# Patient Record
Sex: Male | Born: 1995 | Race: White | Hispanic: No | Marital: Single | Smoking: Former smoker
Health system: Southern US, Community
[De-identification: ages and names within clinical notes are randomized; demographics above are authoritative.]

## PROBLEM LIST (undated history)

## (undated) DIAGNOSIS — F411 Generalized anxiety disorder: Secondary | ICD-10-CM

## (undated) DIAGNOSIS — Z22322 Carrier or suspected carrier of Methicillin resistant Staphylococcus aureus: Secondary | ICD-10-CM

## (undated) HISTORY — PX: WISDOM TOOTH EXTRACTION: SHX21

---
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
## 2020-07-08 ENCOUNTER — Telehealth: Payer: Self-pay

## 2020-07-08 ENCOUNTER — Other Ambulatory Visit: Payer: Self-pay

## 2020-07-08 ENCOUNTER — Emergency Department (INDEPENDENT_AMBULATORY_CARE_PROVIDER_SITE_OTHER)
Admission: EM | Admit: 2020-07-08 | Discharge: 2020-07-08 | Disposition: A | Discharge status: Home / Self Care | Payer: Commercial Managed Care - PPO | Source: Home / Self Care

## 2020-07-08 DIAGNOSIS — L089 Local infection of the skin and subcutaneous tissue, unspecified: Secondary | ICD-10-CM | POA: Diagnosis not present

## 2020-07-08 MED ORDER — MUPIROCIN 2 % EX OINT: TOPICAL_OINTMENT | CUTANEOUS | 0 refills | Status: DC

## 2020-07-08 NOTE — ED Provider Notes (Signed)
[name]    CSN: [id number] Arrival date & time: 07/08/20  4098      History   Chief Complaint Chief Complaint  Patient presents with  . Discharge from belly button    HPI Howard Jones is a 25 y.o. male.   HPI Howard Jones is a 25 y.o. male presenting to UC with c/o small amount of bloody discharge from his belly button last night and a small red bump. Denies pain but had a stinging sensation when he cleaned the area.  Denies fever, chills, n/v/d. No known injury. No hx of same.   History reviewed. No pertinent past medical history.  There are no problems to display for this patient.   History reviewed. No pertinent surgical history.     Home Medications    Prior to Admission medications   Medication Sig Start Date End Date Taking? Authorizing Provider  mupirocin ointment (BACTROBAN) 2 % Apply to wound 3 times daily for 5 days 07/08/20  Yes Doroteo Glassman, Donovan Persley O, PA-C  cephALEXin (KEFLEX) 500 MG capsule Take 1 capsule (500 mg total) by mouth 3 (three) times daily. 10/15/15   Menshew, Charlesetta Ivory, PA-C  guaiFENesin-codeine 100-10 MG/5ML syrup Take 10 mLs by mouth every 4 (four) hours as needed for cough. 08/16/15   Beers, Charmayne Sheer, PA-C  ibuprofen (ADVIL,MOTRIN) 800 MG tablet Take 1 tablet (800 mg total) by mouth every 8 (eight) hours as needed. 08/16/15   Beers, Charmayne Sheer, PA-C  oseltamivir (TAMIFLU) 75 MG capsule Take 1 capsule (75 mg total) by mouth 2 (two) times daily. 08/16/15   Beers, Charmayne Sheer, PA-C    Family History History reviewed. No pertinent family history.  Social History Social History   Tobacco Use  . Smoking status: Never Smoker  . Smokeless tobacco: Never Used  Vaping Use  . Vaping Use: Every day  Substance Use Topics  . Alcohol use: Not Currently     Allergies   Sulfa antibiotics   Review of Systems Review of Systems  Constitutional: Negative for appetite change, chills and fever.  Gastrointestinal: Negative for  abdominal pain, diarrhea, nausea and vomiting.  Skin: Positive for color change and wound.     Physical Exam Triage Vital Signs ED Triage Vitals  Enc Vitals Group     BP 07/08/20 0950 128/87     Pulse Rate 07/08/20 0950 (!) 111     Resp 07/08/20 0950 18     Temp 07/08/20 0950 98.2 F (36.8 C)     Temp Source 07/08/20 0950 Oral     SpO2 07/08/20 0950 98 %     Weight --      Height --      Head Circumference --      Peak Flow --      Pain Score 07/08/20 0951 0     Pain Loc --      Pain Edu? --      Excl. in GC? --    No data found.  Updated Vital Signs BP 128/87 (BP Location: Left Arm)   Pulse 97   Temp 98.2 F (36.8 C) (Oral)   Resp 18   SpO2 98%   Visual Acuity Right Eye Distance:   Left Eye Distance:   Bilateral Distance:    Right Eye Near:   Left Eye Near:    Bilateral Near:     Physical Exam Vitals and nursing note reviewed.  Constitutional:      General: He is  not in acute distress.    Appearance: Normal appearance. He is well-developed and well-nourished. He is not ill-appearing, toxic-appearing or diaphoretic.  HENT:     Head: Normocephalic and atraumatic.  Eyes:     Extraocular Movements: EOM normal.  Cardiovascular:     Rate and Rhythm: Normal rate.  Pulmonary:     Effort: Pulmonary effort is normal. No respiratory distress.  Abdominal:     General: There is no distension.     Palpations: Abdomen is soft.     Tenderness: There is no abdominal tenderness. There is no right CVA tenderness, left CVA tenderness, guarding or rebound.     Hernia: No hernia is present.    Musculoskeletal:        General: Normal range of motion.     Cervical back: Normal range of motion.  Skin:    General: Skin is warm and dry.  Neurological:     Mental Status: He is alert and oriented to person, place, and time.  Psychiatric:        Mood and Affect: Mood and affect normal.        Behavior: Behavior normal.      UC Treatments / Results  Labs (all labs  ordered are listed, but only abnormal results are displayed) Labs Reviewed - No data to display  EKG   Radiology No results found.  Procedures Procedures (including critical care time)  Medications Ordered in UC Medications - No data to display  Initial Impression / Assessment and Plan / UC Course  I have reviewed the triage vital signs and the nursing notes.  Pertinent labs & imaging results that were available during my care of the patient were reviewed by me and considered in my medical decision making (see chart for details).    Will tx with topical mupirocin Home care instructions discussed F/u next week if not improving, sooner if worsening.  Final Clinical Impressions(s) / UC Diagnoses   Final diagnoses:  Skin pustule     Discharge Instructions      Keep area clean with warm water and mild soap, pat dry. Apply the antibiotic as prescribed. Follow up in 1 week if not improving, sooner if worsening.     ED Prescriptions    Medication Sig Dispense Auth. Provider   mupirocin ointment (BACTROBAN) 2 % Apply to wound 3 times daily for 5 days 22 g Lurene Shadow, [place]     PDMP not reviewed this encounter.   Lurene Shadow, [place] 07/08/20 1223

## 2020-07-08 NOTE — Telephone Encounter (Signed)
Pt called because medication was not ready at pharmacy. Called pharmacy and was informed it would be ready in an hour. Pt notified.

## 2020-07-08 NOTE — Discharge Instructions (Signed)
°  Keep area clean with warm water and mild soap, pat dry. Apply the antibiotic as prescribed. Follow up in 1 week if not improving, sooner if worsening.

## 2020-07-08 NOTE — ED Triage Notes (Signed)
Pt c/o discharge from belly button that he noticed last night. Said when he examined further noticed a bed bump. Stinging sensation when he cleaned it. Pain 0/10

## 2020-09-15 ENCOUNTER — Other Ambulatory Visit: Payer: Self-pay

## 2020-09-15 ENCOUNTER — Emergency Department (INDEPENDENT_AMBULATORY_CARE_PROVIDER_SITE_OTHER)
Admission: EM | Admit: 2020-09-15 | Discharge: 2020-09-15 | Disposition: A | Discharge status: Home / Self Care | Payer: Commercial Managed Care - PPO | Source: Home / Self Care | Attending: Family Medicine | Admitting: Family Medicine

## 2020-09-15 DIAGNOSIS — K120 Recurrent oral aphthae: Secondary | ICD-10-CM

## 2020-09-15 MED ORDER — PREDNISONE 20 MG PO TABS: 20.0000 mg | ORAL_TABLET | Freq: Two times a day (BID) | ORAL | 0 refills | Status: DC

## 2020-09-15 NOTE — ED Provider Notes (Signed)
[name]    CSN: [id number] Arrival date & time: 09/15/20  1121      History   Chief Complaint Chief Complaint  Patient presents with  . Sore Throat    HPI Howard Jones is a 25 y.o. male.   HPI  Patient states that he gets sores inside his mouth periodically.  Currently has 1 on the inside of his lip and on his tonsil.  The one back in his tonsils very painful. 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  Ever since that he is very vigilant about any open wounds and wonders whether this could be MRSA in his throat He is otherwise in good health.  He is on no medications.  History reviewed. No pertinent past medical history.  There are no problems to display for this patient.   History reviewed. No pertinent surgical history.     Home Medications    Prior to Admission medications   Medication Sig Start Date End Date Taking? Authorizing Provider  predniSONE (DELTASONE) 20 MG tablet Take 1 tablet (20 mg total) by mouth 2 (two) times daily with a meal. 09/15/20  Yes Eustace Moore, MD  ibuprofen (ADVIL,MOTRIN) 800 MG tablet Take 1 tablet (800 mg total) by mouth every 8 (eight) hours as needed. 08/16/15   Beers, Charmayne Sheer, PA-C    Family History Family History  Problem Relation Age of Onset  . Asthma Mother   . Cancer Mother     Social History Social History   Tobacco Use  . Smoking status: Never Smoker  . Smokeless tobacco: Never Used  Vaping Use  . Vaping Use: Every day  Substance Use Topics  . Alcohol use: Not Currently     Allergies   Sulfa antibiotics   Review of Systems Review of Systems See HPI  Physical Exam Triage Vital Signs ED Triage Vitals  Enc Vitals Group     BP 09/15/20 1131 118/78     Pulse Rate 09/15/20 1131 80     Resp 09/15/20 1131 16     Temp 09/15/20 1131 98.4 F (36.9 C)     Temp Source 09/15/20 1131 Oral     SpO2 09/15/20 1131 97 %     Weight --      Height --      Head Circumference --      Peak  Flow --      Pain Score 09/15/20 1128 2     Pain Loc --      Pain Edu? --      Excl. in GC? --    No data found.  Updated Vital Signs BP 118/78 (BP Location: Left Arm)   Pulse 80   Temp 98.4 F (36.9 C) (Oral)   Resp 16   SpO2 97%       Physical Exam Constitutional:      General: He is not in acute distress.    Appearance: He is well-developed.  HENT:     Head: Normocephalic and atraumatic.     Mouth/Throat:     Mouth: Mucous membranes are moist.     Dentition: Normal dentition.     Tongue: No lesions.     Pharynx: Posterior oropharyngeal erythema present.     Tonsils: No tonsillar exudate or tonsillar abscesses.   Eyes:     Conjunctiva/sclera: Conjunctivae normal.     Pupils: Pupils are equal, round, and reactive to light.  Cardiovascular:     Rate and  Rhythm: Normal rate.  Pulmonary:     Effort: Pulmonary effort is normal. No respiratory distress.  Abdominal:     General: There is no distension.     Palpations: Abdomen is soft.  Musculoskeletal:        General: Normal range of motion.     Cervical back: Normal range of motion.  Skin:    General: Skin is warm and dry.  Neurological:     General: No focal deficit present.     Mental Status: He is alert.  Psychiatric:        Behavior: Behavior normal.      UC Treatments / Results  Labs (all labs ordered are listed, but only abnormal results are displayed) Labs Reviewed - No data to display  EKG   Radiology No results found.  Procedures Procedures (including critical care time)  Medications Ordered in UC Medications - No data to display  Initial Impression / Assessment and Plan / UC Course  I have reviewed the triage vital signs and the nursing notes.  Pertinent labs & imaging results that were available during my care of the patient were reviewed by me and considered in my medical decision making (see chart for details).      Final Clinical Impressions(s) / UC Diagnoses   Final  diagnoses:  Aphthous stomatitis     Discharge Instructions     Try prednisone 2 times a day for 5 days.  I believe this will help with your throat pain.  Be sure to take 2 doses today Warm salt water gargles may help Chloraseptic spray or lozenges will numb the area Tylenol or ibuprofen can be taken for pain Return as needed    ED Prescriptions    Medication Sig Dispense Auth. Provider   predniSONE (DELTASONE) 20 MG tablet Take 1 tablet (20 mg total) by mouth 2 (two) times daily with a meal. 10 tablet Eustace Moore, MD     PDMP not reviewed this encounter.   Eustace Moore, MD 09/15/20 [phone]

## 2020-09-15 NOTE — ED Triage Notes (Signed)
Patient presents to Urgent Care with complaints of sharp stinging pain on his left tonsil since about a week ago. Patient reports he has had ulcers on the inside of his lips before, has not had them on the back of his throat like he does today. Denies fevers.

## 2020-09-15 NOTE — Discharge Instructions (Addendum)
Try prednisone 2 times a day for 5 days.  I believe this will help with your throat pain.  Be sure to take 2 doses today Warm salt water gargles may help Chloraseptic spray or lozenges will numb the area Tylenol or ibuprofen can be taken for pain Return as needed

## 2020-11-25 ENCOUNTER — Other Ambulatory Visit: Payer: Self-pay

## 2020-11-25 ENCOUNTER — Emergency Department
Admission: EM | Admit: 2020-11-25 | Discharge: 2020-11-25 | Disposition: A | Discharge status: Home / Self Care | Payer: Commercial Managed Care - PPO | Source: Home / Self Care

## 2020-11-25 DIAGNOSIS — R509 Fever, unspecified: Secondary | ICD-10-CM

## 2020-11-25 DIAGNOSIS — U071 COVID-19: Secondary | ICD-10-CM

## 2020-11-25 LAB — POC SARS CORONAVIRUS 2 AG -  ED: SARS Coronavirus 2 Ag: POSITIVE — AB

## 2020-11-25 MED ORDER — NIRMATRELVIR/RITONAVIR (PAXLOVID)TABLET: 3.0000 | ORAL_TABLET | Freq: Two times a day (BID) | ORAL | 0 refills | Status: AC

## 2020-11-25 NOTE — ED Triage Notes (Signed)
Pt presents to Urgent Care with c/o fatigue, fever, coughing, and episodes of feeling disoriented x 2 days. Reports negative home COVID test; pt not vaccinated against COVID or flu.

## 2020-11-25 NOTE — ED Provider Notes (Signed)
[name]    CSN: [id number] Arrival date & time: 11/25/20  1143      History   Chief Complaint Chief Complaint  Patient presents with   Fever   Fatigue   Cough    HPI Howard Jones is a 25 y.o. male.   HPI 25 year old male presents with fever, fatigue, cough and episodes of feeling disoriented for 2 days.  Patient is not vaccinated for COVID-19 or influenza.  History reviewed. No pertinent past medical history.  There are no problems to display for this patient.   History reviewed. No pertinent surgical history.     Home Medications    Prior to Admission medications   Medication Sig Start Date End Date Taking? Authorizing Provider  acetaminophen (TYLENOL) 650 MG CR tablet Take 650 mg by mouth every 8 (eight) hours as needed for pain.   Yes [provider]  nirmatrelvir/ritonavir EUA (PAXLOVID) TABS Take 3 tablets by mouth 2 (two) times daily for 5 days. Patient GFR is 74. Take nirmatrelvir (150 mg) two tablets twice daily for 5 days and ritonavir (100 mg) one tablet twice daily for 5 days. 11/25/20 11/30/20 Yes Trevor Iha, FNP  ibuprofen (ADVIL,MOTRIN) 800 MG tablet Take 1 tablet (800 mg total) by mouth every 8 (eight) hours as needed. 08/16/15   Beers, Charmayne Sheer, PA-C  predniSONE (DELTASONE) 20 MG tablet Take 1 tablet (20 mg total) by mouth 2 (two) times daily with a meal. 09/15/20   Eustace Moore, MD    Family History Family History  Problem Relation Age of Onset   Asthma Mother    Cancer Mother    Alcohol abuse Father     Social History Social History   Tobacco Use   Smoking status: Never   Smokeless tobacco: Never  Vaping Use   Vaping Use: Some days  Substance Use Topics   Alcohol use: Not Currently     Allergies   Sulfa antibiotics   Review of Systems Review of Systems  Constitutional:  Positive for fatigue and fever.  HENT:  Positive for congestion.   Eyes: Negative.   Respiratory:  Positive for cough.    Gastrointestinal: Negative.   Genitourinary: Negative.   Musculoskeletal: Negative.   Skin: Negative.   Neurological:        Reports disorientation for 2 days    Physical Exam Triage Vital Signs ED Triage Vitals  Enc Vitals Group     BP 11/25/20 1218 109/68     Pulse Rate 11/25/20 1218 (!) 115     Resp 11/25/20 1218 18     Temp 11/25/20 1218 100.3 F (37.9 C)     Temp Source 11/25/20 1218 Oral     SpO2 11/25/20 1219 96 %     Weight 11/25/20 1215 166 lb (75.3 kg)     Height 11/25/20 1215 5\' 9"  (1.753 m)     Head Circumference --      Peak Flow --      Pain Score 11/25/20 1215 2     Pain Loc --      Pain Edu? --      Excl. in GC? --    No data found.  Updated Vital Signs BP 109/68 (BP Location: Right Arm)   Pulse (!) 115   Temp 100.3 F (37.9 C) (Oral)   Resp 18   Ht 5\' 9"  (1.753 m)   Wt 166 lb (75.3 kg)   SpO2 96%   BMI 24.51 kg/m  Physical Exam Constitutional:      General: He is not in acute distress.    Appearance: Normal appearance. He is normal weight. He is not ill-appearing.  HENT:     Head: Normocephalic and atraumatic.     Right Ear: Tympanic membrane and ear canal normal.     Left Ear: Tympanic membrane and ear canal normal.     Nose: Nose normal.     Mouth/Throat:     Mouth: Mucous membranes are moist.     Pharynx: Oropharynx is clear.  Eyes:     Extraocular Movements: Extraocular movements intact.     Conjunctiva/sclera: Conjunctivae normal.     Pupils: Pupils are equal, round, and reactive to light.  Cardiovascular:     Rate and Rhythm: Normal rate and regular rhythm.     Pulses: Normal pulses.     Heart sounds: Normal heart sounds.  Pulmonary:     Effort: Pulmonary effort is normal.     Breath sounds: Normal breath sounds.     Comments: No adventitious breath sounds noted Musculoskeletal:        General: Normal range of motion.     Cervical back: Normal range of motion and neck supple. No tenderness.  Lymphadenopathy:     Cervical:  Cervical adenopathy present.  Skin:    General: Skin is warm and dry.  Neurological:     General: No focal deficit present.     Mental Status: He is alert and oriented to person, place, and time.  Psychiatric:        Mood and Affect: Mood normal.        Behavior: Behavior normal.     UC Treatments / Results  Labs (all labs ordered are listed, but only abnormal results are displayed) Labs Reviewed  POC SARS CORONAVIRUS 2 AG -  ED - Abnormal; Notable for the following components:      Result Value   SARS Coronavirus 2 Ag Positive (*)    All other components within normal limits    EKG   Radiology No results found.  Procedures Procedures (including critical care time)  Medications Ordered in UC Medications - No data to display  Initial Impression / Assessment and Plan / UC Course  I have reviewed the triage vital signs and the nursing notes.  Pertinent labs & imaging results that were available during my care of the patient were reviewed by me and considered in my medical decision making (see chart for details).    MDM: 1.  COVID-19, 2.  Fever.  Patient discharged home, hemodynamically stable. Final Clinical Impressions(s) / UC Diagnoses   Final diagnoses:  COVID-19  Fever, unspecified     Discharge Instructions      Advised/instructed patient to take medication as directed with food to completion.  Advised patient may alternate between Tylenol 1000 mg and/or ibuprofen 800 mg daily, as needed for fever and myalgias.  Encouraged patient to increase daily water intake while taking these medications.  Work note provided to patient for 10 days for quarantine.     ED Prescriptions     Medication Sig Dispense Auth. Provider   nirmatrelvir/ritonavir EUA (PAXLOVID) TABS Take 3 tablets by mouth 2 (two) times daily for 5 days. Patient GFR is 74. Take nirmatrelvir (150 mg) two tablets twice daily for 5 days and ritonavir (100 mg) one tablet twice daily for 5 days. 30  tablet Trevor Iha, FNP      PDMP not reviewed this encounter.  Trevor Iha, FNP 11/25/20 1338

## 2020-11-25 NOTE — Discharge Instructions (Addendum)
Advised/instructed patient to take medication as directed with food to completion.  Advised patient may alternate between Tylenol 1000 mg and/or ibuprofen 800 mg daily, as needed for fever and myalgias.  Encouraged patient to increase daily water intake while taking these medications.  Work note provided to patient for 10 days for quarantine.

## 2021-06-02 ENCOUNTER — Other Ambulatory Visit: Payer: Self-pay

## 2021-06-02 ENCOUNTER — Emergency Department (INDEPENDENT_AMBULATORY_CARE_PROVIDER_SITE_OTHER)
Admission: EM | Admit: 2021-06-02 | Discharge: 2021-06-02 | Disposition: A | Discharge status: Home / Self Care | Payer: Commercial Managed Care - PPO | Source: Home / Self Care | Attending: Family Medicine | Admitting: Family Medicine

## 2021-06-02 DIAGNOSIS — J111 Influenza due to unidentified influenza virus with other respiratory manifestations: Secondary | ICD-10-CM

## 2021-06-02 DIAGNOSIS — R509 Fever, unspecified: Secondary | ICD-10-CM

## 2021-06-02 HISTORY — DX: Carrier or suspected carrier of methicillin resistant Staphylococcus aureus: Z22.322

## 2021-06-02 LAB — POC SARS CORONAVIRUS 2 AG -  ED: SARS Coronavirus 2 Ag: NEGATIVE

## 2021-06-02 LAB — POCT URINALYSIS DIP (MANUAL ENTRY)
Bilirubin, UA: NEGATIVE
Blood, UA: NEGATIVE
Glucose, UA: NEGATIVE mg/dL
Leukocytes, UA: NEGATIVE
Nitrite, UA: NEGATIVE
Protein Ur, POC: NEGATIVE mg/dL
Spec Grav, UA: 1.02 (ref 1.010–1.025)
Urobilinogen, UA: 1 E.U./dL
pH, UA: 7 (ref 5.0–8.0)

## 2021-06-02 LAB — POCT RAPID STREP A (OFFICE): Rapid Strep A Screen: NEGATIVE

## 2021-06-02 LAB — POCT INFLUENZA A/B
Influenza A, POC: NEGATIVE
Influenza B, POC: NEGATIVE

## 2021-06-02 MED ORDER — ACETAMINOPHEN 325 MG PO TABS
650.0000 mg | ORAL_TABLET | Freq: Once | ORAL | Status: AC
  Administered 2021-06-02: 650 mg via ORAL

## 2021-06-02 MED ORDER — OSELTAMIVIR PHOSPHATE 75 MG PO CAPS: 75.0000 mg | ORAL_CAPSULE | Freq: Two times a day (BID) | ORAL | 0 refills | Status: DC

## 2021-06-02 NOTE — Discharge Instructions (Signed)
The flu test, the COVID test, and the strep test are all negative,  in spite of this I believe you have a flu illness  I am prescribing Tamiflu to help with the symptoms.  This is an antiviral medication.  I recommend you start tonight Take Tamiflu 2 times a day for 5 days Take ibuprofen or Tylenol for pain and fever May use over-the-counter cough and cold medicines Call for problems

## 2021-06-02 NOTE — ED Provider Notes (Signed)
[facility]    CSN: [id number] Arrival date & time: 06/02/21  1717      History   Chief Complaint Chief Complaint  Patient presents with   Back Pain   Fever    HPI Maximum Howard Jones is a 25 y.o. male.   HPI  Pleasant 25 year old gentleman.  Felt well when he went to work this morning.  He was working and had the sudden onset of backache and body aches.  Feels very tired.  No cough cold or runny nose.  He did have a fever today.  No sore throat.  No urinary symptoms.  No nausea vomiting or diarrhea  Past Medical History:  Diagnosis Date   MRSA (methicillin resistant staph aureus) culture positive    In childhood    There are no problems to display for this patient.   History reviewed. No pertinent surgical history.     Home Medications    Prior to Admission medications   Medication Sig Start Date End Date Taking? Authorizing Provider  oseltamivir (TAMIFLU) 75 MG capsule Take 1 capsule (75 mg total) by mouth every 12 (twelve) hours. 06/02/21  Yes Eustace Moore, MD  acetaminophen (TYLENOL) 650 MG CR tablet Take 650 mg by mouth every 8 (eight) hours as needed for pain.    [provider]  ibuprofen (ADVIL,MOTRIN) 800 MG tablet Take 1 tablet (800 mg total) by mouth every 8 (eight) hours as needed. 08/16/15   Beers, Charmayne Sheer, PA-C    Family History Family History  Problem Relation Age of Onset   Asthma Mother    Cancer Mother    Alcohol abuse Father     Social History Social History   Tobacco Use   Smoking status: Former    Types: Cigarettes   Smokeless tobacco: Never  Vaping Use   Vaping Use: Some days  Substance Use Topics   Alcohol use: Not Currently   Drug use: Not Currently     Allergies   Sulfa antibiotics   Review of Systems Review of Systems See HPI  Physical Exam Triage Vital Signs ED Triage Vitals  Enc Vitals Group     BP 06/02/21 1735 125/78     Pulse Rate 06/02/21 1735 (!) 112     Resp 06/02/21 1735 20      Temp 06/02/21 1735 99.9 F (37.7 C)     Temp Source 06/02/21 1735 Oral     SpO2 06/02/21 1735 99 %     Weight 06/02/21 1729 160 lb (72.6 kg)     Height 06/02/21 1729 5\' 9"  (1.753 m)     Head Circumference --      Peak Flow --      Pain Score 06/02/21 1729 2     Pain Loc --      Pain Edu? --      Excl. in GC? --    No data found.  Updated Vital Signs BP 125/78 (BP Location: Right Arm)    Pulse (!) 112    Temp (!) 102.6 F (39.2 C) (Tympanic)    Resp 20    Ht 5\' 9"  (1.753 m)    Wt 72.6 kg    SpO2 99%    BMI 23.63 kg/m       Physical Exam Constitutional:      General: He is not in acute distress.    Appearance: Normal appearance. He is well-developed and normal weight.  HENT:     Head: Normocephalic and atraumatic.  Right Ear: Tympanic membrane and ear canal normal.     Left Ear: Tympanic membrane and ear canal normal.     Nose: Nose normal. No congestion.     Mouth/Throat:     Mouth: Mucous membranes are moist.     Pharynx: Posterior oropharyngeal erythema present.     Comments: Mild erythema Eyes:     Pupils: Pupils are equal, round, and reactive to light.     Comments: Slight conjunctival injection  Cardiovascular:     Rate and Rhythm: Normal rate and regular rhythm.     Heart sounds: Normal heart sounds.  Pulmonary:     Effort: Pulmonary effort is normal. No respiratory distress.     Breath sounds: Normal breath sounds.  Abdominal:     General: Abdomen is flat. There is no distension.     Palpations: Abdomen is soft.  Musculoskeletal:        General: Normal range of motion.     Cervical back: Normal range of motion and neck supple.  Lymphadenopathy:     Cervical: No cervical adenopathy.  Skin:    General: Skin is warm and dry.  Neurological:     General: No focal deficit present.     Mental Status: He is alert.  Psychiatric:        Mood and Affect: Mood normal.        Behavior: Behavior normal.     UC Treatments / Results  Labs (all labs  ordered are listed, but only abnormal results are displayed) Labs Reviewed  POCT URINALYSIS DIP (MANUAL ENTRY) - Abnormal; Notable for the following components:      Result Value   Ketones, POC UA small (15) (*)    All other components within normal limits  CULTURE, GROUP A STREP  POCT INFLUENZA A/B  POC SARS CORONAVIRUS 2 AG -  ED  POCT RAPID STREP A (OFFICE)    EKG   Radiology No results found.  Procedures Procedures (including critical care time)  Medications Ordered in UC Medications  acetaminophen (TYLENOL) tablet 650 mg (650 mg Oral Given 06/02/21 1825)    Initial Impression / Assessment and Plan / UC Course  I have reviewed the triage vital signs and the nursing notes.  Pertinent labs & imaging results that were available during my care of the patient were reviewed by me and considered in my medical decision making (see chart for details).     Final Clinical Impressions(s) / UC Diagnoses   Final diagnoses:  Fever, unspecified  Influenza-like illness     Discharge Instructions      The flu test, the COVID test, and the strep test are all negative,  in spite of this I believe you have a flu illness  I am prescribing Tamiflu to help with the symptoms.  This is an antiviral medication.  I recommend you start tonight Take Tamiflu 2 times a day for 5 days Take ibuprofen or Tylenol for pain and fever May use over-the-counter cough and cold medicines Call for problems     ED Prescriptions     Medication Sig Dispense Auth. Provider   oseltamivir (TAMIFLU) 75 MG capsule Take 1 capsule (75 mg total) by mouth every 12 (twelve) hours. 10 capsule Eustace Moore, MD      PDMP not reviewed this encounter.   Eustace Moore, MD 06/02/21 [phone]

## 2021-06-02 NOTE — ED Triage Notes (Signed)
Pt presents to Urgent Care with c/o lower back pain and fever today. Denies having any urinary and respiratory problems.

## 2021-06-05 LAB — CULTURE, GROUP A STREP: Strep A Culture: NEGATIVE

## 2022-08-17 ENCOUNTER — Ambulatory Visit
Admission: RE | Admit: 2022-08-17 | Discharge: 2022-08-17 | Disposition: A | Discharge status: Home / Self Care | Payer: Commercial Managed Care - PPO | Source: Ambulatory Visit | Attending: Emergency Medicine | Admitting: Emergency Medicine

## 2022-08-17 VITALS — BP 115/73 | HR 91 | Temp 99.3°F | Resp 14

## 2022-08-17 DIAGNOSIS — J029 Acute pharyngitis, unspecified: Secondary | ICD-10-CM

## 2022-08-17 LAB — POCT RAPID STREP A (OFFICE): Rapid Strep A Screen: NEGATIVE

## 2022-08-17 NOTE — ED Triage Notes (Signed)
Pt presents with c/o sore throat and fever

## 2022-08-17 NOTE — Discharge Instructions (Signed)
We will call you if your covid test returns positive, or if anything grows on your throat culture  Continue symptomatic care in the meantime. Drink lots of fluids!

## 2022-08-17 NOTE — ED Provider Notes (Signed)
[facility]    CSN: [id number] Arrival date & time: 08/17/22  [id number]      History   Chief Complaint Chief Complaint  Patient presents with   Fever    Fever was 101.8 when I woke up, I've had a sore throat for 2 days - Entered by patient   Sore Throat    HPI Howard Jones is a 27 y.o. male.  Here with 3 day history of sore throat Subjective fever this morning 102 Took dayquil Tolerating fluids No congestion, cough, n/v/d Possible sick contacts at work  Past Medical History:  Diagnosis Date   MRSA (methicillin resistant staph aureus) culture positive    In childhood    There are no problems to display for this patient.   History reviewed. No pertinent surgical history.     Home Medications    Prior to Admission medications   Medication Sig Start Date End Date Taking? Authorizing Provider  acetaminophen (TYLENOL) 650 MG CR tablet Take 650 mg by mouth every 8 (eight) hours as needed for pain.    [provider]  ibuprofen (ADVIL,MOTRIN) 800 MG tablet Take 1 tablet (800 mg total) by mouth every 8 (eight) hours as needed. 08/16/15   Beers, Pierce Crane, PA-C    Family History Family History  Problem Relation Age of Onset   Asthma Mother    Cancer Mother    Alcohol abuse Father     Social History Social History   Tobacco Use   Smoking status: Former    Types: Cigarettes   Smokeless tobacco: Never  Vaping Use   Vaping Use: Some days  Substance Use Topics   Alcohol use: Not Currently   Drug use: Not Currently     Allergies   Sulfa antibiotics   Review of Systems Review of Systems As per HPI  Physical Exam Triage Vital Signs ED Triage Vitals  Enc Vitals Group     BP      Pulse      Resp      Temp      Temp src      SpO2      Weight      Height      Head Circumference      Peak Flow      Pain Score      Pain Loc      Pain Edu?      Excl. in [place]?    No data found.  Updated Vital Signs BP 115/73 (BP Location:  Right Arm)   Pulse 91   Temp 99.3 F (37.4 C) (Oral)   Resp 14   SpO2 97%   Visual Acuity Right Eye Distance:   Left Eye Distance:   Bilateral Distance:    Right Eye Near:   Left Eye Near:    Bilateral Near:     Physical Exam Vitals and nursing note reviewed.  Constitutional:      General: He is not in acute distress.    Appearance: He is not ill-appearing.  HENT:     Nose: No congestion or rhinorrhea.     Mouth/Throat:     Mouth: Mucous membranes are moist.     Pharynx: Oropharynx is clear. Posterior oropharyngeal erythema present.     Tonsils: No tonsillar exudate. 0 on the right. 0 on the left.  Eyes:     Conjunctiva/sclera: Conjunctivae normal.  Cardiovascular:     Rate and Rhythm: Normal rate and regular rhythm.  Pulses: Normal pulses.     Heart sounds: Normal heart sounds.  Pulmonary:     Effort: Pulmonary effort is normal.     Breath sounds: Normal breath sounds.  Musculoskeletal:     Cervical back: Normal range of motion.  Lymphadenopathy:     Cervical: No cervical adenopathy.  Skin:    General: Skin is warm and dry.  Neurological:     Mental Status: He is alert and oriented to person, place, and time.      UC Treatments / Results  Labs (all labs ordered are listed, but only abnormal results are displayed) Labs Reviewed  CULTURE, GROUP A STREP ([place])  SARS CORONAVIRUS 2 (TAT 6-24 HRS)  POCT RAPID STREP A (OFFICE)    EKG   Radiology No results found.  Procedures Procedures (including critical care time)  Medications Ordered in UC Medications - No data to display  Initial Impression / Assessment and Plan / UC Course  I have reviewed the triage vital signs and the nursing notes.  Pertinent labs & imaging results that were available during my care of the patient were reviewed by me and considered in my medical decision making (see chart for details).  Afebrile in clinic. Well appearing  Strep test negative, culture pending Treat with  symptomatic care - recommend ibu or tylenol for pain and fever Patient requests covid test - pending Work note provided  Final Clinical Impressions(s) / UC Diagnoses   Final diagnoses:  Viral pharyngitis     Discharge Instructions      We will call you if your covid test returns positive, or if anything grows on your throat culture  Continue symptomatic care in the meantime. Drink lots of fluids!      ED Prescriptions   None    PDMP not reviewed this encounter.   Les Pou, [place] 08/17/22 [id number]

## 2022-08-18 LAB — CULTURE, GROUP A STREP (THRC)

## 2022-08-18 LAB — SARS CORONAVIRUS 2 (TAT 6-24 HRS): SARS Coronavirus 2: NEGATIVE

## 2022-08-19 LAB — CULTURE, GROUP A STREP (THRC)

## 2022-12-15 ENCOUNTER — Ambulatory Visit
Admission: EM | Admit: 2022-12-15 | Discharge: 2022-12-15 | Disposition: A | Discharge status: Home / Self Care | Payer: Commercial Managed Care - PPO | Attending: Family Medicine | Admitting: Family Medicine

## 2022-12-15 DIAGNOSIS — R0981 Nasal congestion: Secondary | ICD-10-CM | POA: Diagnosis not present

## 2022-12-15 DIAGNOSIS — J0100 Acute maxillary sinusitis, unspecified: Secondary | ICD-10-CM

## 2022-12-15 DIAGNOSIS — R509 Fever, unspecified: Secondary | ICD-10-CM | POA: Diagnosis not present

## 2022-12-15 MED ORDER — AZITHROMYCIN 250 MG PO TABS: 250.0000 mg | ORAL_TABLET | Freq: Every day | ORAL | 0 refills | Status: AC

## 2022-12-15 NOTE — ED Triage Notes (Addendum)
Pt presents to uc with co of fevers since Wednesday  Highest 100.4 and has since been mild with sinus pressure and headaches and ear stuffiness. Has taken tylenol earlier this week for symptoms.  Pt reports covid neg at home.

## 2022-12-15 NOTE — ED Provider Notes (Signed)
[facility]    CSN: [id number] Arrival date & time: 12/15/22  0835      History   Chief Complaint Chief Complaint  Patient presents with   Fever    HPI Howard Jones is a 27 y.o. male.   HPI  Past Medical History:  Diagnosis Date   MRSA (methicillin resistant staph aureus) culture positive    In childhood    There are no problems to display for this patient.   History reviewed. No pertinent surgical history.     Home Medications    Prior to Admission medications   Medication Sig Start Date End Date Taking? Authorizing Provider  azithromycin (ZITHROMAX) 250 MG tablet Take 1 tablet (250 mg total) by mouth daily. Take first 2 tablets together, then 1 every day until finished. 12/15/22  Yes Trevor Iha, FNP    Family History Family History  Problem Relation Age of Onset   Asthma Mother    Cancer Mother    Alcohol abuse Father     Social History Social History   Tobacco Use   Smoking status: Former    Types: Cigarettes   Smokeless tobacco: Never  Vaping Use   Vaping Use: Some days  Substance Use Topics   Alcohol use: Not Currently   Drug use: Not Currently     Allergies   Sulfa antibiotics   Review of Systems Review of Systems  HENT:  Positive for congestion, postnasal drip and sore throat.   All other systems reviewed and are negative.    Physical Exam Triage Vital Signs ED Triage Vitals  Enc Vitals Group     BP 12/15/22 0847 118/80     Pulse Rate 12/15/22 0847 100     Resp 12/15/22 0847 16     Temp 12/15/22 0847 98 F (36.7 C)     Temp src --      SpO2 12/15/22 0847 98 %     Weight --      Height --      Head Circumference --      Peak Flow --      Pain Score 12/15/22 0845 3     Pain Loc --      Pain Edu? --      Excl. in GC? --    No data found.  Updated Vital Signs BP 118/80   Pulse 100   Temp 98 F (36.7 C)   Resp 16   SpO2 98%   Physical Exam Vitals and nursing note reviewed.  Constitutional:       Appearance: Normal appearance. He is normal weight. He is ill-appearing.  HENT:     Head: Normocephalic and atraumatic.     Right Ear: Tympanic membrane, ear canal and external ear normal.     Left Ear: Tympanic membrane, ear canal and external ear normal.     Mouth/Throat:     Mouth: Mucous membranes are moist.     Pharynx: Oropharynx is clear.  Eyes:     Extraocular Movements: Extraocular movements intact.     Conjunctiva/sclera: Conjunctivae normal.     Pupils: Pupils are equal, round, and reactive to light.  Cardiovascular:     Rate and Rhythm: Normal rate and regular rhythm.     Pulses: Normal pulses.     Heart sounds: Normal heart sounds.  Pulmonary:     Effort: Pulmonary effort is normal.     Breath sounds: Normal breath sounds. No wheezing, rhonchi or rales.  Musculoskeletal:  General: Normal range of motion.     Cervical back: Normal range of motion and neck supple.  Skin:    General: Skin is warm and dry.  Neurological:     General: No focal deficit present.     Mental Status: He is alert and oriented to person, place, and time. Mental status is at baseline.  Psychiatric:        Mood and Affect: Mood normal.        Behavior: Behavior normal.      UC Treatments / Results  Labs (all labs ordered are listed, but only abnormal results are displayed) Labs Reviewed - No data to display  EKG   Radiology No results found.  Procedures Procedures (including critical care time)  Medications Ordered in UC Medications - No data to display  Initial Impression / Assessment and Plan / UC Course  I have reviewed the triage vital signs and the nursing notes.  Pertinent labs & imaging results that were available during my care of the patient were reviewed by me and considered in my medical decision making (see chart for details).     MDM: 1.  Subacute maxillary sinusitis-Rx'd Zithromax (500 mg day 1, then 250 mg daily x 4 days) advised patient if symptoms  worsen may start this medication.  Fever, unspecified-advised OTC Tylenol 1 g every 6 hours for fever (oral temperature greater than 100.3). Advised patient may take OTC Tylenol 1000 mg every 6 hours for fever (oral temperature greater than 100.3).  Advised patient if symptoms worsen and/or unresolved may start Zithromax.  Advised patient to take medication as directed with food to completion if starting this medication.  Encouraged increase daily water intake to 64 ounces per day while taking these medications.  Advised if symptoms worsen and/or unresolved please follow-up with PCP or here for further evaluation.  Patient discharged home, hemodynamically stable.   Final Clinical Impressions(s) / UC Diagnoses   Final diagnoses:  Fever, unspecified  Congestion of nasal sinus  Subacute maxillary sinusitis     Discharge Instructions      Advised patient may take OTC Tylenol 1000 mg every 6 hours for fever (oral temperature greater than 100.3).  Advised patient if symptoms worsen and/or unresolved may start Zithromax.  Advised patient to take medication as directed with food to completion if starting this medication.  Encouraged increase daily water intake to 64 ounces per day while taking these medications.  Advised if symptoms worsen and/or unresolved please follow-up with PCP or here for further evaluation.     ED Prescriptions     Medication Sig Dispense Auth. Provider   azithromycin (ZITHROMAX) 250 MG tablet Take 1 tablet (250 mg total) by mouth daily. Take first 2 tablets together, then 1 every day until finished. 6 tablet Trevor Iha, FNP      PDMP not reviewed this encounter.   Trevor Iha, FNP 12/15/22 [phone]

## 2022-12-15 NOTE — Discharge Instructions (Addendum)
Advised patient may take OTC Tylenol 1000 mg every 6 hours for fever (oral temperature greater than 100.3).  Advised patient if symptoms worsen and/or unresolved may start Zithromax.  Advised patient to take medication as directed with food to completion if starting this medication.  Encouraged increase daily water intake to 64 ounces per day while taking these medications.  Advised if symptoms worsen and/or unresolved please follow-up with PCP or here for further evaluation.

## 2023-07-16 ENCOUNTER — Ambulatory Visit
Admission: RE | Admit: 2023-07-16 | Discharge: 2023-07-16 | Disposition: A | Discharge status: Home / Self Care | Payer: Commercial Managed Care - PPO | Source: Ambulatory Visit

## 2023-07-16 ENCOUNTER — Other Ambulatory Visit: Payer: Self-pay

## 2023-07-16 VITALS — BP 118/77 | HR 119 | Temp 99.2°F | Resp 16

## 2023-07-16 DIAGNOSIS — A084 Viral intestinal infection, unspecified: Secondary | ICD-10-CM

## 2023-07-16 HISTORY — DX: Generalized anxiety disorder: F41.1

## 2023-07-16 MED ORDER — ONDANSETRON 4 MG PO TBDP: 4.0000 mg | ORAL_TABLET | Freq: Three times a day (TID) | ORAL | 0 refills | Status: AC | PRN

## 2023-07-16 MED ORDER — ONDANSETRON 4 MG PO TBDP
4.0000 mg | ORAL_TABLET | Freq: Once | ORAL | Status: AC
  Administered 2023-07-16: 4 mg via ORAL

## 2023-07-16 NOTE — Discharge Instructions (Addendum)
Symptoms most consistent with viral gastroenteritis.  This does not require antibiotic treatment.  The most important thing is to drink plenty of fluids to stay hydrated.  We can treat the nausea with the following: Zofran 4 mg orally disintegrating tablet every 8 hours as needed for nausea.  First dose given here at 1130. Drink plenty of fluids, can try electrolyte fluids such as Pedialyte, Gatorade or Powerade. Slowly advance your diet as tolerated but try to stick to bland food for the first few days.  Avoid spicy or greasy foods Return to urgent care or PCP if symptoms worsen or fail to resolve.

## 2023-07-16 NOTE — ED Provider Notes (Signed)
[facility]    CSN: [id number] Arrival date & time: 07/16/23  1052      History   Chief Complaint Chief Complaint  Patient presents with   Nausea    Has been throwing up since 12:30 am and has a fever of 101 - Entered by patient    HPI Howard Jones is a 28 y.o. male.   28 year old male who presents urgent care with complaints of nausea, vomiting and diarrhea.  This started last night.  He started with nausea followed by vomiting.  He began having diarrhea shortly after this.  The last time he vomited was this morning at 0600.  He is having trouble keeping fluids down.  He has also felt feverish.  He reports that several members of his family had the norovirus recently.  He has not used any over-the-counter medication.     Past Medical History:  Diagnosis Date   GAD (generalized anxiety disorder)    MRSA (methicillin resistant staph aureus) culture positive    In childhood    There are no active problems to display for this patient.   Past Surgical History:  Procedure Laterality Date   WISDOM TOOTH EXTRACTION         Home Medications    Prior to Admission medications   Medication Sig Start Date End Date Taking? Authorizing Provider  sertraline (ZOLOFT) 50 MG tablet Take 50 mg by mouth daily.   Yes [provider]  azithromycin (ZITHROMAX) 250 MG tablet Take 1 tablet (250 mg total) by mouth daily. Take first 2 tablets together, then 1 every day until finished. 12/15/22   Trevor Iha, FNP    Family History Family History  Problem Relation Age of Onset   Asthma Mother    Cancer Mother    Alcohol abuse Father     Social History Social History   Tobacco Use   Smoking status: Former    Types: Cigarettes   Smokeless tobacco: Former  Building services engineer status: Some Days  Substance Use Topics   Alcohol use: Never   Drug use: Never     Allergies   Sulfa antibiotics   Review of Systems Review of Systems  Constitutional:   Positive for appetite change and fever. Negative for chills.  HENT:  Negative for ear pain and sore throat.   Eyes:  Negative for pain and visual disturbance.  Respiratory:  Negative for cough and shortness of breath.   Cardiovascular:  Negative for chest pain and palpitations.  Gastrointestinal:  Positive for diarrhea, nausea and vomiting. Negative for abdominal pain.  Genitourinary:  Negative for dysuria and hematuria.  Musculoskeletal:  Negative for arthralgias and back pain.  Skin:  Negative for color change and rash.  Neurological:  Negative for seizures and syncope.  All other systems reviewed and are negative.    Physical Exam Triage Vital Signs ED Triage Vitals  Encounter Vitals Group     BP 07/16/23 1057 118/77     Systolic BP Percentile --      Diastolic BP Percentile --      Pulse Rate 07/16/23 1057 (!) 119     Resp 07/16/23 1057 16     Temp 07/16/23 1057 99.2 F (37.3 C)     Temp Source 07/16/23 1057 Oral     SpO2 07/16/23 1057 99 %     Weight --      Height --      Head Circumference --  Peak Flow --      Pain Score 07/16/23 1101 2     Pain Loc --      Pain Education --      Exclude from Growth Chart --    No data found.  Updated Vital Signs BP 118/77   Pulse (!) 119   Temp 99.2 F (37.3 C) (Oral)   Resp 16   SpO2 99%   Visual Acuity Right Eye Distance:   Left Eye Distance:   Bilateral Distance:    Right Eye Near:   Left Eye Near:    Bilateral Near:     Physical Exam Vitals and nursing note reviewed.  Constitutional:      General: He is not in acute distress.    Appearance: He is well-developed.  HENT:     Head: Normocephalic and atraumatic.  Eyes:     Conjunctiva/sclera: Conjunctivae normal.  Cardiovascular:     Rate and Rhythm: Normal rate and regular rhythm.     Heart sounds: No murmur heard. Pulmonary:     Effort: Pulmonary effort is normal. No respiratory distress.     Breath sounds: Normal breath sounds.  Abdominal:      Palpations: Abdomen is soft.     Tenderness: There is no abdominal tenderness.  Musculoskeletal:        General: No swelling.     Cervical back: Neck supple.  Skin:    General: Skin is warm and dry.     Capillary Refill: Capillary refill takes less than 2 seconds.  Neurological:     Mental Status: He is alert.  Psychiatric:        Mood and Affect: Mood normal.      UC Treatments / Results  Labs (all labs ordered are listed, but only abnormal results are displayed) Labs Reviewed - No data to display  EKG   Radiology No results found.  Procedures Procedures (including critical care time)  Medications Ordered in UC Medications - No data to display  Initial Impression / Assessment and Plan / UC Course  I have reviewed the triage vital signs and the nursing notes.  Pertinent labs & imaging results that were available during my care of the patient were reviewed by me and considered in my medical decision making (see chart for details).     Viral gastroenteritis   Symptoms most consistent with viral gastroenteritis.  This does not require antibiotic treatment.  The most important thing is to drink plenty of fluids to stay hydrated.  We can treat the nausea with the following: Zofran 4 mg orally disintegrating tablet every 8 hours as needed for nausea.  First dose given here at 1130. Drink plenty of fluids, can try electrolyte fluids such as Pedialyte, Gatorade or Powerade. Slowly advance your diet as tolerated but try to stick to bland food for the first few days.  Avoid spicy or greasy foods Return to urgent care or PCP if symptoms worsen or fail to resolve.    Final Clinical Impressions(s) / UC Diagnoses   Final diagnoses:  None   Discharge Instructions   None    ED Prescriptions   None    PDMP not reviewed this encounter.   Landis Martins, [place] 07/16/23 1146

## 2023-07-16 NOTE — ED Triage Notes (Addendum)
Last night felt nauseous, had abd pain and then came diarrhea. Started vomiting next. Last time vomited 0600. Has not taken otc medication. Family had norovirus a few weeks ago.
